# Patient Record
Sex: Male | Born: 1950 | Race: White | Hispanic: No | Marital: Married | Smoking: Never smoker
Health system: Southern US, Community
[De-identification: ages and names within clinical notes are randomized; demographics above are authoritative.]

## PROBLEM LIST (undated history)

## (undated) DIAGNOSIS — E7800 Pure hypercholesterolemia, unspecified: Secondary | ICD-10-CM

## (undated) DIAGNOSIS — I639 Cerebral infarction, unspecified: Secondary | ICD-10-CM

## (undated) DIAGNOSIS — I10 Essential (primary) hypertension: Secondary | ICD-10-CM

## (undated) DIAGNOSIS — K469 Unspecified abdominal hernia without obstruction or gangrene: Secondary | ICD-10-CM

---

## 2011-03-10 ENCOUNTER — Ambulatory Visit: Payer: Self-pay | Admitting: Family Medicine

## 2011-03-13 ENCOUNTER — Emergency Department: Payer: Self-pay | Admitting: Emergency Medicine

## 2011-10-17 ENCOUNTER — Ambulatory Visit: Payer: Self-pay

## 2012-05-08 ENCOUNTER — Ambulatory Visit: Payer: Self-pay | Admitting: Emergency Medicine

## 2012-07-29 ENCOUNTER — Ambulatory Visit: Payer: Self-pay | Admitting: Family Medicine

## 2012-07-30 ENCOUNTER — Emergency Department: Payer: Self-pay | Admitting: Unknown Physician Specialty

## 2014-05-16 IMAGING — CR DG LUMBAR SPINE 2-3V
1 series · 3 of 3 positions shown · non-contrast
Comparison: none

REASON FOR EXAM: pain
COMMENTS:

[Series 1: t lumbar spine ap · 0.14mm/px · 3 of 3 slices shown]
[im 1/3]
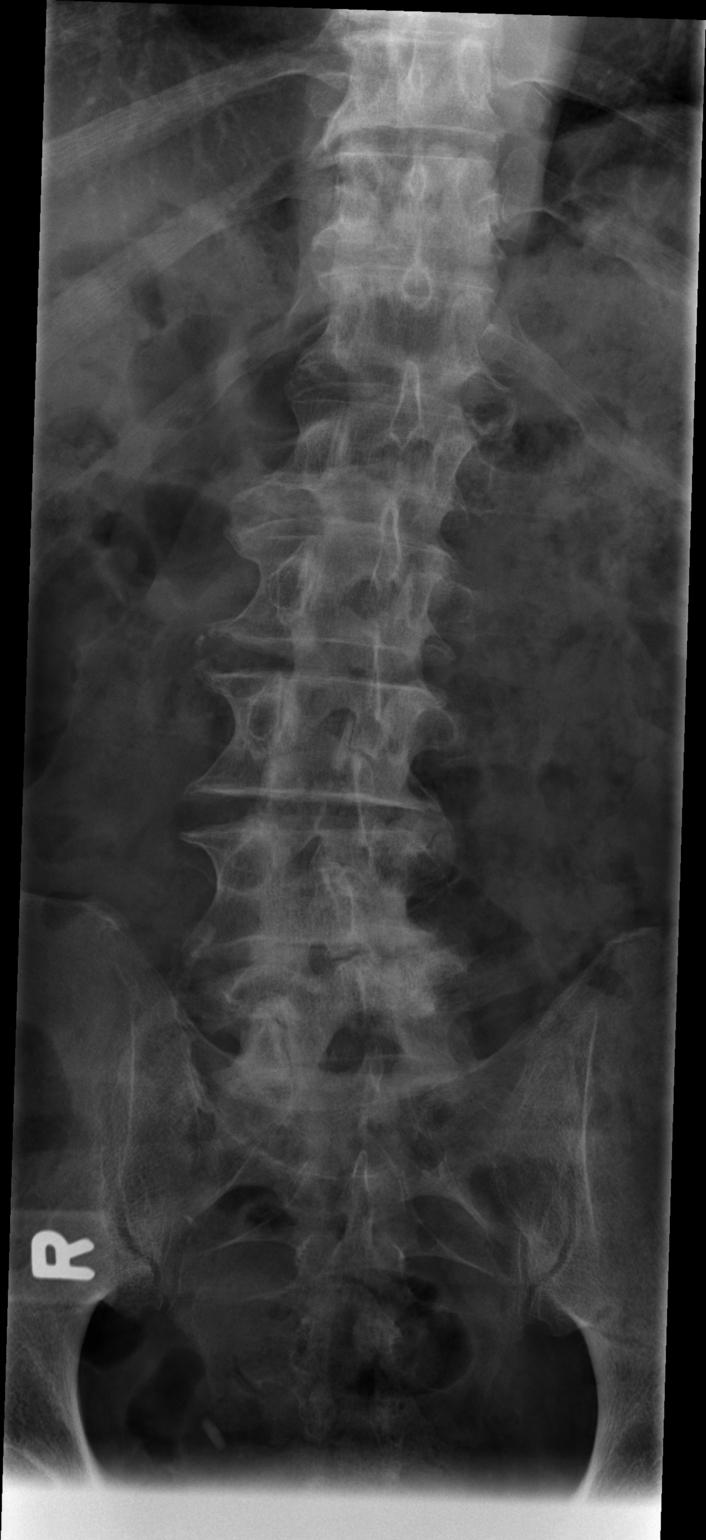
[im 2/3]
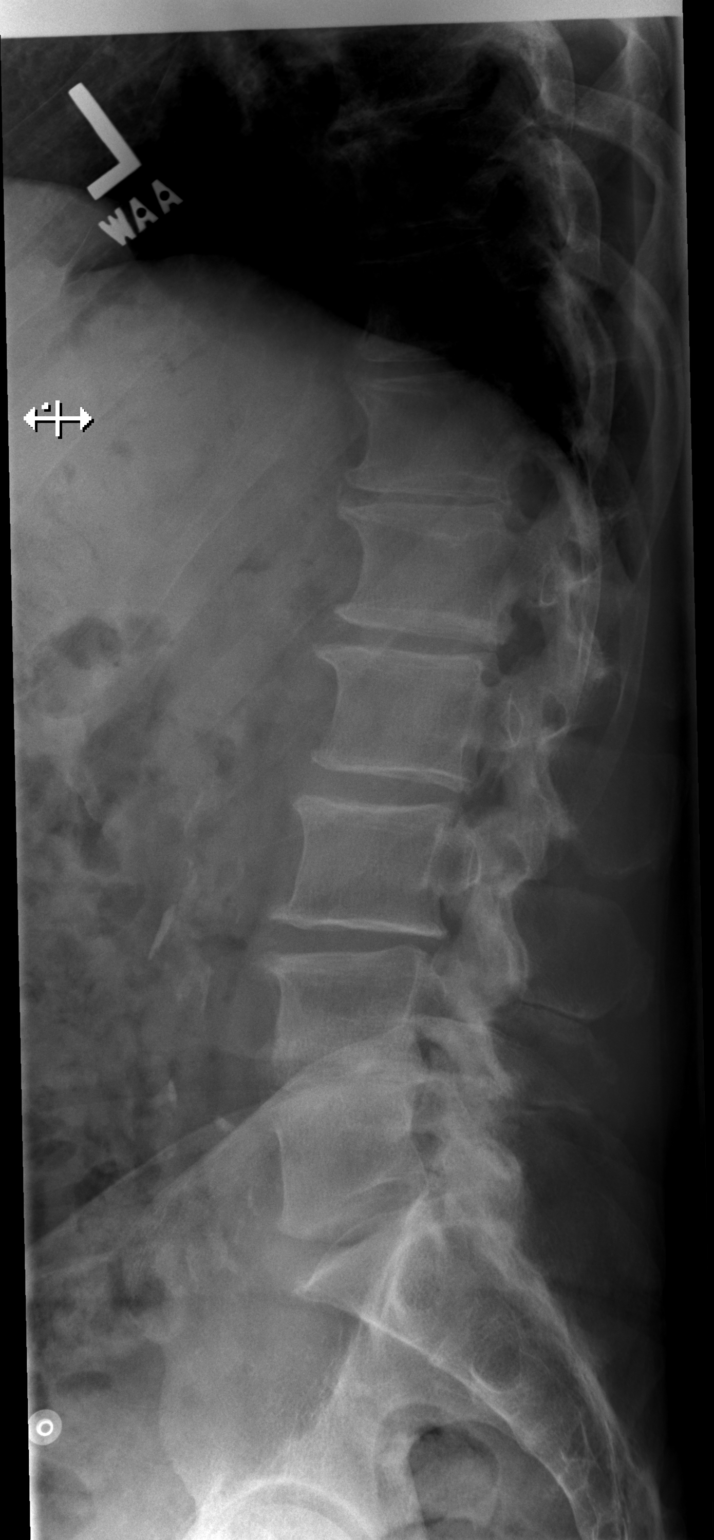
[im 3/3]
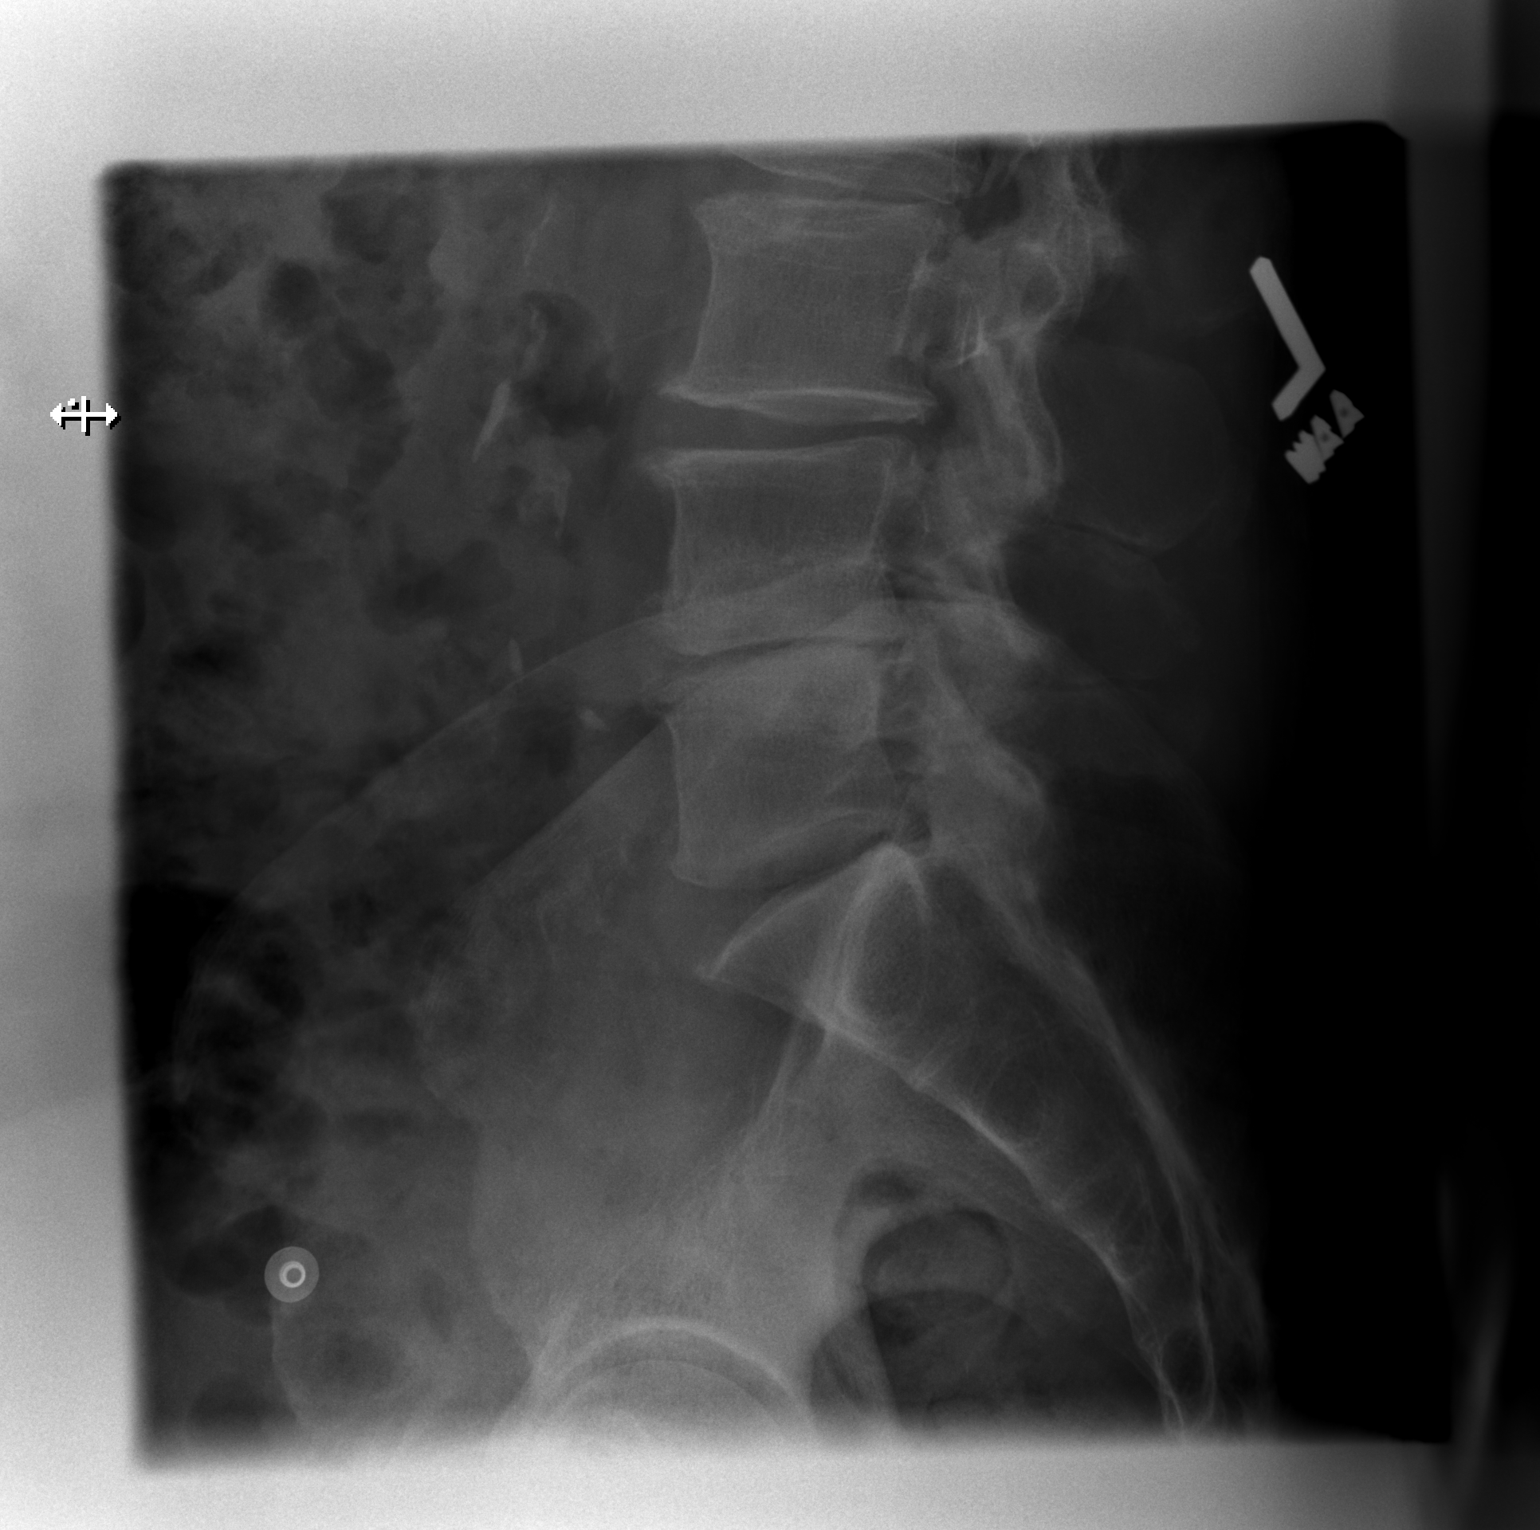

[3 of 3 positions shown; findings below may reference images not displayed]

PROCEDURE:     DXR - DXR LUMBAR SPINE AP AND LATERAL  - July 30, 2012  [DATE]

RESULT:     The lumbar vertebral bodies are preserved in height. Mild
degenerative disc changes are noted at multiple levels. There is moderate
degenerative disc change at L4-L5 to the left of midline. There is mild
facet joint hypertrophy at L4-L5 and L5-S1. Gentle curvature of the lumbar
spine with the convexity toward the right is present. The observed portions
of the sacrum are normal.
IMPRESSION: There is degenerative change of the lumbar spine diffusely.
More significant change with disc space narrowing is seen at L4-L5.

[REDACTED]

## 2017-05-18 ENCOUNTER — Ambulatory Visit
Admission: EM | Admit: 2017-05-18 | Discharge: 2017-05-18 | Disposition: A | Discharge status: Home / Self Care | Payer: BLUE CROSS/BLUE SHIELD | Attending: Family Medicine | Admitting: Family Medicine

## 2017-05-18 ENCOUNTER — Encounter: Payer: Self-pay | Admitting: Gynecology

## 2017-05-18 DIAGNOSIS — L03114 Cellulitis of left upper limb: Secondary | ICD-10-CM

## 2017-05-18 DIAGNOSIS — Z23 Encounter for immunization: Secondary | ICD-10-CM | POA: Diagnosis not present

## 2017-05-18 DIAGNOSIS — L255 Unspecified contact dermatitis due to plants, except food: Secondary | ICD-10-CM

## 2017-05-18 DIAGNOSIS — L0100 Impetigo, unspecified: Secondary | ICD-10-CM

## 2017-05-18 HISTORY — DX: Pure hypercholesterolemia, unspecified: E78.00

## 2017-05-18 HISTORY — DX: Cerebral infarction, unspecified: I63.9

## 2017-05-18 HISTORY — DX: Unspecified abdominal hernia without obstruction or gangrene: K46.9

## 2017-05-18 HISTORY — DX: Essential (primary) hypertension: I10

## 2017-05-18 MED ORDER — PREDNISONE 10 MG PO TABS: ORAL_TABLET | ORAL | 0 refills | Status: AC

## 2017-05-18 MED ORDER — SULFAMETHOXAZOLE-TRIMETHOPRIM 800-160 MG PO TABS: 1.0000 | ORAL_TABLET | Freq: Two times a day (BID) | ORAL | 0 refills | Status: AC

## 2017-05-18 MED ORDER — MUPIROCIN 2 % EX OINT: TOPICAL_OINTMENT | CUTANEOUS | 0 refills | Status: AC

## 2017-05-18 MED ORDER — TETANUS-DIPHTH-ACELL PERTUSSIS 5-2.5-18.5 LF-MCG/0.5 IM SUSP
0.5000 mL | Freq: Once | INTRAMUSCULAR | Status: AC
  Administered 2017-05-18: 0.5 mL via INTRAMUSCULAR

## 2017-05-18 NOTE — Discharge Instructions (Signed)
Take medication as prescribed. Rest. Drink plenty of fluids.  ° °Follow up with your primary care physician this week. Return to Urgent care for new or worsening concerns.  ° °

## 2017-05-18 NOTE — ED Triage Notes (Signed)
Patient c/o poison Ivy x 4 days.

## 2017-05-18 NOTE — ED Provider Notes (Addendum)
[facility] ____________________________________________  Time seen: Approximately 10:09 AM  I have reviewed the triage vital signs and the nursing notes.   HISTORY  Chief Complaint Rash   HPI Mitchell Gilbert is a 66 y.o. male  presenting for evaluation of rashes and present for the last 4 days. Patient reports earlier the same day of rash presentation, he was at work and cleaning [employer] brush. Patient reports he later noticed that there was poison oak and poison ivy in that same brush. Patient reports that evening he began to have itchy rash to left arm. States that she rash has since spread. Reports unrelieved with over-the-counter Caladryl. Reports now has 2 areas to left arm that is painful and burning. States rash is also present to right arm, left leg. Denies drainage. Denies fevers. Reports continues to eat and drink well. Denies history of MRSA. Unsure of last tetanus immunization. Denies trauma. Denies other aggravating or alleviating factors. No other recent medications tried.Denies chest pain, shortness of breath, abdominal pain, dysuria, extremity pain, extremity swelling. Denies recent sickness. Denies recent antibiotic use. Denies renal insufficiency. Denies cardiac history.  [facility]    Past Medical History:  Diagnosis Date  . Hernia of abdominal cavity   . Hypercholesteremia   . Hypertension   . Stroke [facility](HCC)     There are no active problems to display for this patient.   History reviewed. No pertinent surgical history.   No current facility-administered medications for this encounter.   Current Outpatient Prescriptions:  .  aspirin EC 81 MG tablet, Take 81 mg by mouth daily., Disp: , Rfl:  .  atorvastatin (LIPITOR) 80 MG tablet, Take 80 mg by mouth daily., Disp: , Rfl:  .  hydrochlorothiazide (HYDRODIURIL) 25 MG tablet, Take 25 mg by mouth daily., Disp: , Rfl:  .  ibuprofen (ADVIL,MOTRIN) 800 MG tablet, Take 800 mg by mouth every 8  (eight) hours as needed., Disp: , Rfl:  .  lisinopril (PRINIVIL,ZESTRIL) 20 MG tablet, Take 20 mg by mouth daily., Disp: , Rfl:  .  mupirocin ointment (BACTROBAN) 2 %, Apply three times a day for 7 days., Disp: 22 g, Rfl: 0 .  predniSONE (DELTASONE) 10 MG tablet, Start 60 mg po day one, then 50 mg po day two, taper by 10 mg daily until complete., Disp: 21 tablet, Rfl: 0 .  sulfamethoxazole-trimethoprim (BACTRIM DS,SEPTRA DS) 800-160 MG tablet, Take 1 tablet by mouth 2 (two) times daily., Disp: 20 tablet, Rfl: 0  Allergies Patient has no known allergies.  No family history on file.  Social History Social History  Substance Use Topics  . Smoking status: Never Smoker  . Smokeless tobacco: Never Used  . Alcohol use No    Review of Systems Constitutional: No fever/chills Cardiovascular: Denies chest pain. Respiratory: Denies shortness of breath. Gastrointestinal: No abdominal pain.   Musculoskeletal: Negative for back pain. Skin: Positive for rash.   ____________________________________________   PHYSICAL EXAM:  VITAL SIGNS: ED Triage Vitals  Enc Vitals Group     BP 05/18/17 1001 (!) 154/79     Pulse Rate 05/18/17 1001 68     Resp 05/18/17 1001 16     Temp 05/18/17 1001 98.5 F (36.9 C)     Temp Source 05/18/17 1001 Oral     SpO2 05/18/17 1001 98 %     Weight 05/18/17 0957 146 lb (66.2 kg)     Height 05/18/17 0957 5\' 3"  (1.6 m)     Head Circumference --  Peak Flow --      Pain Score 05/18/17 0958 5     Pain Loc --      Pain Edu? --      Excl. in GC? --     Constitutional: Alert and oriented. Well appearing and in no acute distress. ENT      Head: Normocephalic and atraumatic. Cardiovascular: Normal rate, regular rhythm. Grossly normal heart sounds.  Good peripheral circulation. Respiratory: Normal respiratory effort without tachypnea nor retractions. Breath sounds are clear and equal bilaterally. No wheezes, rales, rhonchi. Musculoskeletal:   No midline  cervical, thoracic or lumbar tenderness to palpation.  Neurologic:  Normal speech and language. Speech is normal. No gait instability.  Skin:  Skin is warm, dry.  Except: pruritic vesicular rash in clusters and linear present to bilateral arms. Also left ac rash with erythematous and slightly indurated and tender base without edema, left volar wrist with clear appearing fluid filled vesicles with mild erythematous base, right forearm with two areas of accompanying honey colored crusting, bilateral hand grips strong and equal, no paresthesias, bilateral upper extremities with fully range of motion and normal distal sensation and otherwise nontender to palpation.  Psychiatric: Mood and affect are normal. Speech and behavior are normal. Patient exhibits appropriate insight and judgment   ___________________________________________   LABS (all labs ordered are listed, but only abnormal results are displayed)  Labs Reviewed - No data to display ____________________________________________   PROCEDURES Procedures   INITIAL IMPRESSION / ASSESSMENT AND PLAN / ED COURSE  Pertinent labs & imaging results that were available during my care of the patient were reviewed by me and considered in my medical decision making (see chart for details).  Plan patient. No acute distress. Rash subjective report and clinical appearance consistent with plant contact dermatitis, suspect from poison oak and IV, however also concern for secondary cellulitis appearance of impetigo to right forearm. Patient states he does not want to take a long course of prednisone. Will treat with six-day taper prednisone, oral bactrim and topical Bactroban. Encouraged supportive care, avoidance of scratching. Tetanus immunization updated. Discussed strict follow-up and return parameters.Avoid trigger. Discussed indication, risks and benefits of medications with patient.  Discussed follow up with Primary care physician this week.  Discussed follow up and return parameters including no resolution or any worsening concerns. Patient verbalized understanding and agreed to plan.   ____________________________________________   FINAL CLINICAL IMPRESSION(S) / ED DIAGNOSES  Final diagnoses:  Contact dermatitis due to plant  Cellulitis of left arm  Impetigo     Discharge Medication List as of 05/18/2017 10:25 AM    START taking these medications   Details  mupirocin ointment (BACTROBAN) 2 % Apply three times a day for 7 days., Normal    predniSONE (DELTASONE) 10 MG tablet Start 60 mg po day one, then 50 mg po day two, taper by 10 mg daily until complete., Normal    sulfamethoxazole-trimethoprim (BACTRIM DS,SEPTRA DS) 800-160 MG tablet Take 1 tablet by mouth 2 (two) times daily., Starting Sun 05/18/2017, Until Wed 05/28/2017, Normal        Note: This dictation was prepared with Dragon dictation along with smaller phrase technology. Any transcriptional errors that result from this process are unintentional.         Renford DillsMiller, [employer], NP 05/18/17 1738    Renford DillsMiller, Addam Goeller, NP 05/18/17 1739
